# Patient Record
Sex: Female | Born: 1960 | Race: White | Hispanic: No | Smoking: Former smoker
Health system: Southern US, Community
[De-identification: ages and names within clinical notes are randomized; demographics above are authoritative.]

## PROBLEM LIST (undated history)

## (undated) DIAGNOSIS — E119 Type 2 diabetes mellitus without complications: Secondary | ICD-10-CM

## (undated) DIAGNOSIS — E785 Hyperlipidemia, unspecified: Secondary | ICD-10-CM

## (undated) DIAGNOSIS — I10 Essential (primary) hypertension: Secondary | ICD-10-CM

## (undated) HISTORY — DX: Essential (primary) hypertension: I10

## (undated) HISTORY — DX: Hyperlipidemia, unspecified: E78.5

## (undated) HISTORY — DX: Type 2 diabetes mellitus without complications: E11.9

---

## 1999-06-11 ENCOUNTER — Other Ambulatory Visit: Admission: RE | Admit: 1999-06-11 | Discharge: 1999-06-11 | Payer: Self-pay | Admitting: Obstetrics and Gynecology

## 2000-07-20 ENCOUNTER — Other Ambulatory Visit: Admission: RE | Admit: 2000-07-20 | Discharge: 2000-07-20 | Payer: Self-pay | Admitting: Obstetrics and Gynecology

## 2001-07-20 ENCOUNTER — Encounter: Admission: RE | Admit: 2001-07-20 | Discharge: 2001-10-18 | Payer: Self-pay | Admitting: Family Medicine

## 2001-11-22 ENCOUNTER — Other Ambulatory Visit: Admission: RE | Admit: 2001-11-22 | Discharge: 2001-11-22 | Payer: Self-pay | Admitting: Obstetrics and Gynecology

## 2002-06-15 ENCOUNTER — Ambulatory Visit (HOSPITAL_COMMUNITY): Admission: RE | Admit: 2002-06-15 | Discharge: 2002-06-15 | Payer: Self-pay | Admitting: Family Medicine

## 2003-01-31 ENCOUNTER — Other Ambulatory Visit: Admission: RE | Admit: 2003-01-31 | Discharge: 2003-01-31 | Payer: Self-pay | Admitting: Obstetrics and Gynecology

## 2004-03-10 ENCOUNTER — Other Ambulatory Visit: Admission: RE | Admit: 2004-03-10 | Discharge: 2004-03-10 | Payer: Self-pay | Admitting: Obstetrics and Gynecology

## 2004-04-13 ENCOUNTER — Ambulatory Visit (HOSPITAL_BASED_OUTPATIENT_CLINIC_OR_DEPARTMENT_OTHER): Admission: RE | Admit: 2004-04-13 | Discharge: 2004-04-13 | Payer: Self-pay | Admitting: Plastic Surgery

## 2004-04-13 ENCOUNTER — Ambulatory Visit (HOSPITAL_COMMUNITY): Admission: RE | Admit: 2004-04-13 | Discharge: 2004-04-13 | Payer: Self-pay | Admitting: Plastic Surgery

## 2004-04-13 ENCOUNTER — Encounter (INDEPENDENT_AMBULATORY_CARE_PROVIDER_SITE_OTHER): Payer: Self-pay | Admitting: Specialist

## 2005-05-20 ENCOUNTER — Other Ambulatory Visit: Admission: RE | Admit: 2005-05-20 | Discharge: 2005-05-20 | Payer: Self-pay | Admitting: Obstetrics and Gynecology

## 2009-05-18 ENCOUNTER — Ambulatory Visit (HOSPITAL_COMMUNITY): Admission: RE | Admit: 2009-05-18 | Discharge: 2009-05-18 | Payer: Self-pay | Admitting: Urology

## 2013-11-15 ENCOUNTER — Other Ambulatory Visit: Payer: Self-pay | Admitting: Obstetrics and Gynecology

## 2013-11-15 DIAGNOSIS — R928 Other abnormal and inconclusive findings on diagnostic imaging of breast: Secondary | ICD-10-CM

## 2013-12-04 ENCOUNTER — Other Ambulatory Visit: Payer: Self-pay

## 2013-12-06 ENCOUNTER — Ambulatory Visit
Admission: RE | Admit: 2013-12-06 | Discharge: 2013-12-06 | Disposition: A | Discharge status: Home / Self Care | Payer: Managed Care, Other (non HMO) | Source: Ambulatory Visit | Attending: Obstetrics and Gynecology | Admitting: Obstetrics and Gynecology

## 2013-12-06 DIAGNOSIS — R928 Other abnormal and inconclusive findings on diagnostic imaging of breast: Secondary | ICD-10-CM

## 2016-01-27 DIAGNOSIS — M1990 Unspecified osteoarthritis, unspecified site: Secondary | ICD-10-CM | POA: Insufficient documentation

## 2016-01-27 DIAGNOSIS — E782 Mixed hyperlipidemia: Secondary | ICD-10-CM | POA: Insufficient documentation

## 2016-01-27 DIAGNOSIS — I10 Essential (primary) hypertension: Secondary | ICD-10-CM | POA: Insufficient documentation

## 2016-02-15 DIAGNOSIS — S86011A Strain of right Achilles tendon, initial encounter: Secondary | ICD-10-CM | POA: Insufficient documentation

## 2016-02-18 ENCOUNTER — Encounter: Payer: BLUE CROSS/BLUE SHIELD | Attending: Family Medicine | Admitting: Skilled Nursing Facility1

## 2016-02-18 ENCOUNTER — Encounter: Payer: Self-pay | Admitting: Skilled Nursing Facility1

## 2016-02-18 DIAGNOSIS — E119 Type 2 diabetes mellitus without complications: Secondary | ICD-10-CM | POA: Insufficient documentation

## 2016-02-18 DIAGNOSIS — I10 Essential (primary) hypertension: Secondary | ICD-10-CM | POA: Diagnosis not present

## 2016-02-18 DIAGNOSIS — Z713 Dietary counseling and surveillance: Secondary | ICD-10-CM | POA: Diagnosis not present

## 2016-02-18 DIAGNOSIS — E781 Pure hyperglyceridemia: Secondary | ICD-10-CM | POA: Insufficient documentation

## 2016-02-18 NOTE — Progress Notes (Signed)
Diabetes Self-Management Education  Visit Type: First/Initial  Appt. Start Time: 11:00 Appt. End Time: 12:30  02/18/2016  [name], identified by name and date of birth, is a 55 y.o. female with a diagnosis of Diabetes: Type 2.   ASSESSMENT  Height  (1.702 m), weight (!) 324 lb (147 kg). Body mass index is 50.75 kg/m. Pt states she has ankle surgery in a few days for a aceles tendon rupture. Pt states she ate about 2 hours ago and a blood sugar reading was taken with a number of 116.     Diabetes Self-Management Education - 02/18/16 1111      Visit Information   Visit Type First/Initial     Initial Visit   Diabetes Type Type 2   Are you currently following a meal plan? No   Are you taking your medications as prescribed? Not on Medications   Date Diagnosed August 2017     Health Coping   How would you rate your overall health? Fair     Psychosocial Assessment   Patient Belief/Attitude about Diabetes Motivated to manage diabetes     Pre-Education Assessment   Patient understands the diabetes disease and treatment process. Needs Instruction   Patient understands incorporating nutritional management into lifestyle. Needs Instruction   Patient undertands incorporating physical activity into lifestyle. Needs Instruction   Patient understands using medications safely. Needs Instruction   Patient understands monitoring blood glucose, interpreting and using results Needs Instruction   Patient understands prevention, detection, and treatment of acute complications. Needs Instruction   Patient understands prevention, detection, and treatment of chronic complications. Needs Instruction   Patient understands how to develop strategies to address psychosocial issues. Needs Instruction   Patient understands how to develop strategies to promote health/change behavior. Needs Instruction     Complications   Last HgB A1C per patient/outside source 6.9 %   Have you had a  dilated eye exam in the past 12 months? No   Have you had a dental exam in the past 12 months? Yes   Are you checking your feet? No     Dietary Intake   Breakfast egg white omelet with cheese----greek yogurt   Snack (morning) atkins protein bars------cherries----homemade salsa with blacbean chips---edememe    Lunch chicken salad with cheese with nuts------chips and salsa and banana   Snack (afternoon) banana and peanut butter   Dinner vegetable, pork tenderloin   Snack (evening) almonds   Beverage(s) water, diet coke, sparkling water     Exercise   Exercise Type ADL's     Patient Education   Previous Diabetes Education No   Disease state  Definition of diabetes, type 1 and 2, and the diagnosis of diabetes;Factors that contribute to the development of diabetes   Nutrition management  Role of diet in the treatment of diabetes and the relationship between the three main macronutrients and blood glucose level;Food label reading, portion sizes and measuring food.;Carbohydrate counting;Reviewed blood glucose goals for pre and post meals and how to evaluate the patients' food intake on their blood glucose level.;Information on hints to eating out and maintain blood glucose control.   Physical activity and exercise  Role of exercise on diabetes management, blood pressure control and cardiac health.   Monitoring Purpose and frequency of SMBG.;Taught/evaluated SMBG meter.;Yearly dilated eye exam;Daily foot exams   Chronic complications Relationship between chronic complications and blood glucose control;Lipid levels, blood glucose control and heart disease;Retinopathy and reason for yearly dilated eye exams;Assessed and discussed foot  care and prevention of foot problems   Psychosocial adjustment Role of stress on diabetes;Worked with patient to identify barriers to care and solutions;Identified and addressed patients feelings and concerns about diabetes     Individualized Goals (developed by patient)    Nutrition Follow meal plan discussed;Adjust meds/carbs with exercise as discussed   Physical Activity Exercise 1-2 times per week;15 minutes per day   Monitoring  --  Think about starting to test   Reducing Risk do foot checks daily;increase portions of nuts and seeds     Post-Education Assessment   Patient understands the diabetes disease and treatment process. Demonstrates understanding / competency   Patient understands incorporating nutritional management into lifestyle. Demonstrates understanding / competency   Patient undertands incorporating physical activity into lifestyle. Demonstrates understanding / competency   Patient understands using medications safely. Demonstrates understanding / competency   Patient understands monitoring blood glucose, interpreting and using results Demonstrates understanding / competency   Patient understands prevention, detection, and treatment of acute complications. Demonstrates understanding / competency   Patient understands prevention, detection, and treatment of chronic complications. Demonstrates understanding / competency   Patient understands how to develop strategies to address psychosocial issues. Demonstrates understanding / competency   Patient understands how to develop strategies to promote health/change behavior. Demonstrates understanding / competency     Outcomes   Expected Outcomes Demonstrated interest in learning. Expect positive outcomes   Future DMSE PRN   Program Status Completed      Individualized Plan for Diabetes Self-Management Training:   Learning Objective:  Patient will have a greater understanding of diabetes self-management. Patient education plan is to attend individual and/or group sessions per assessed needs and concerns.   Plan:   Patient Instructions  -Eat 3 meals a day and snacks in between (if you are hungry for the snacks) -A meal: carbohydrate, protein, vegetable -A snack: A Fruit OR Vegetable AND  Protein -Honor your body by listening to your hunger and fullness cues -First thought: Am I hungry? -Second thought: (if the answer is yes I am hungry) Does this meal have vegetables? Protein? Carbohydrate?  -Third thought: Are there more vegetables on my plate compared to Protein and Carbohydrates? -After you have finished your first serving Do Not go back for more until you have waited 20-30 minutes and checked in with your body by asking Am I Still Hungry?   -Try to be as active as possible -Start trying to eat within an hour-hour and a half of waking -If you are eating snacks: 2-3 hours after the meal and before the next meal -If you are not eating snacks do not go more than 5 hours without eating your next meal -For your bread the first ingredient should be whole wheat flour   Expected Outcomes:  Demonstrated interest in learning. Expect positive outcomes  Education material provided: Living Well with Diabetes, Meal plan card, My Plate and Snack sheet  If problems or questions, patient to contact team via:  Phone  Future DSME appointment: PRN

## 2016-02-18 NOTE — Patient Instructions (Signed)
-  Eat 3 meals a day and snacks in between (if you are hungry for the snacks) -A meal: carbohydrate, protein, vegetable -A snack: A Fruit OR Vegetable AND Protein -Honor your body by listening to your hunger and fullness cues -First thought: Am I hungry? -Second thought: (if the answer is yes I am hungry) Does this meal have vegetables? Protein? Carbohydrate?  -Third thought: Are there more vegetables on my plate compared to Protein and Carbohydrates? -After you have finished your first serving Do Not go back for more until you have waited 20-30 minutes and checked in with your body by asking Am I Still Hungry?   -Try to be as active as possible -Start trying to eat within an hour-hour and a half of waking -If you are eating snacks: 2-3 hours after the meal and before the next meal -If you are not eating snacks do not go more than 5 hours without eating your next meal -For your bread the first ingredient should be whole wheat flour

## 2016-02-26 ENCOUNTER — Encounter: Payer: Self-pay | Admitting: Skilled Nursing Facility1

## 2016-05-16 ENCOUNTER — Other Ambulatory Visit: Payer: Self-pay | Admitting: Obstetrics and Gynecology

## 2016-05-16 DIAGNOSIS — R928 Other abnormal and inconclusive findings on diagnostic imaging of breast: Secondary | ICD-10-CM

## 2016-05-19 ENCOUNTER — Ambulatory Visit
Admission: RE | Admit: 2016-05-19 | Discharge: 2016-05-19 | Disposition: A | Discharge status: Home / Self Care | Payer: BLUE CROSS/BLUE SHIELD | Source: Ambulatory Visit | Attending: Obstetrics and Gynecology | Admitting: Obstetrics and Gynecology

## 2016-05-19 ENCOUNTER — Other Ambulatory Visit: Payer: Self-pay | Admitting: Obstetrics and Gynecology

## 2016-05-19 DIAGNOSIS — R928 Other abnormal and inconclusive findings on diagnostic imaging of breast: Secondary | ICD-10-CM

## 2016-05-19 DIAGNOSIS — R229 Localized swelling, mass and lump, unspecified: Principal | ICD-10-CM

## 2016-05-19 DIAGNOSIS — IMO0002 Reserved for concepts with insufficient information to code with codable children: Secondary | ICD-10-CM

## 2016-05-24 ENCOUNTER — Ambulatory Visit
Admission: RE | Admit: 2016-05-24 | Discharge: 2016-05-24 | Disposition: A | Discharge status: Home / Self Care | Payer: BLUE CROSS/BLUE SHIELD | Source: Ambulatory Visit | Attending: Obstetrics and Gynecology | Admitting: Obstetrics and Gynecology

## 2016-05-24 ENCOUNTER — Other Ambulatory Visit: Payer: Self-pay | Admitting: Obstetrics and Gynecology

## 2016-05-24 DIAGNOSIS — IMO0002 Reserved for concepts with insufficient information to code with codable children: Secondary | ICD-10-CM

## 2016-05-24 DIAGNOSIS — R229 Localized swelling, mass and lump, unspecified: Principal | ICD-10-CM

## 2017-08-12 IMAGING — US ULTRASOUND LEFT BREAST LIMITED
1 series · 8 of 8 positions shown · non-contrast
Comparison: Previous exam(s).

CLINICAL DATA: Possible mass identified in the left breast on
recent screening mammogram performed 05/10/2016.

EXAM:
2D DIGITAL DIAGNOSTIC LEFT MAMMOGRAM WITH CAD AND ADJUNCT TOMO
ULTRASOUND LEFT BREAST

[Series 1: ultrasound left breast limited · 0.06mm/px · 8 of 8 slices shown]
[im 1/8]
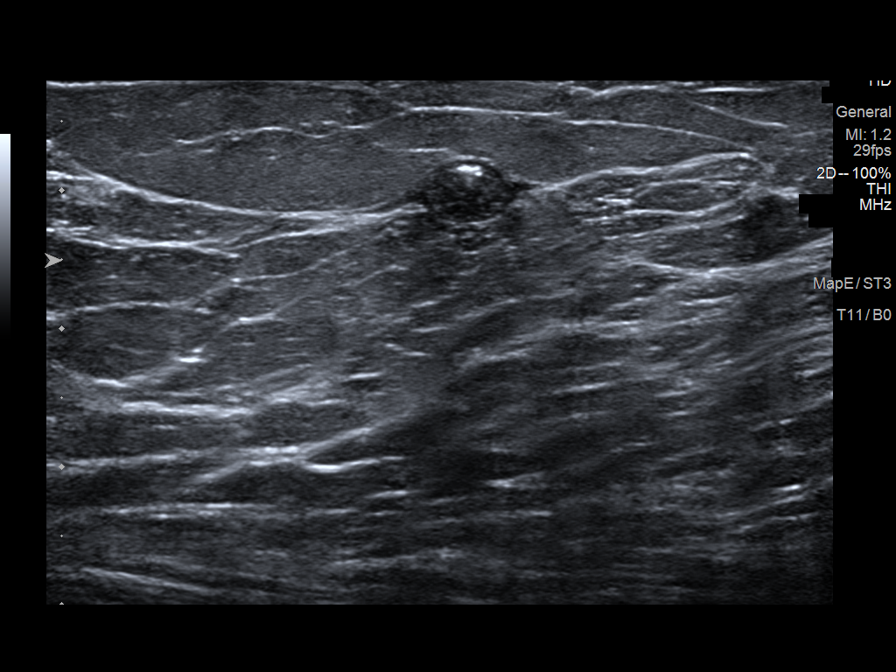
[im 2/8]
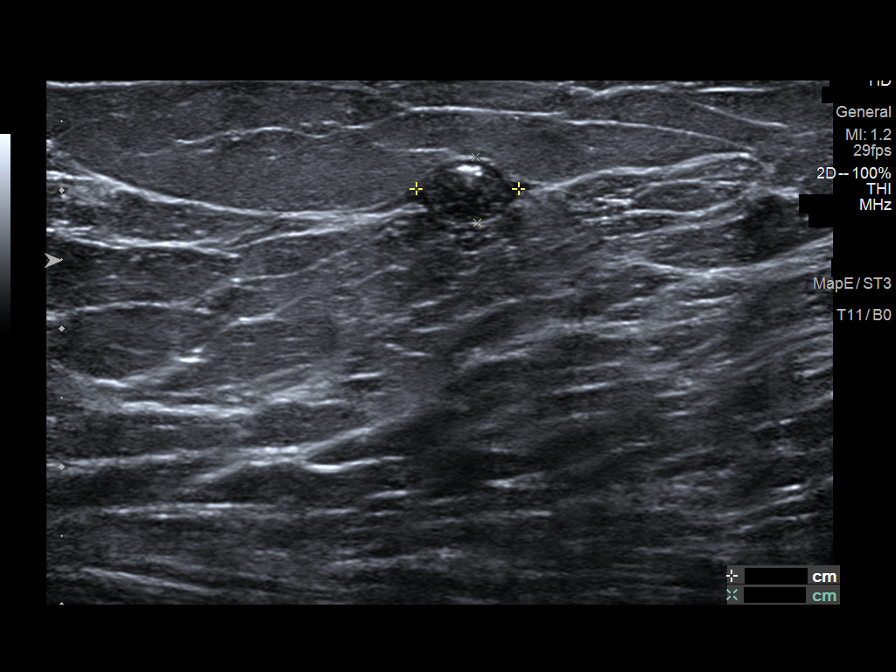
[im 3/8]
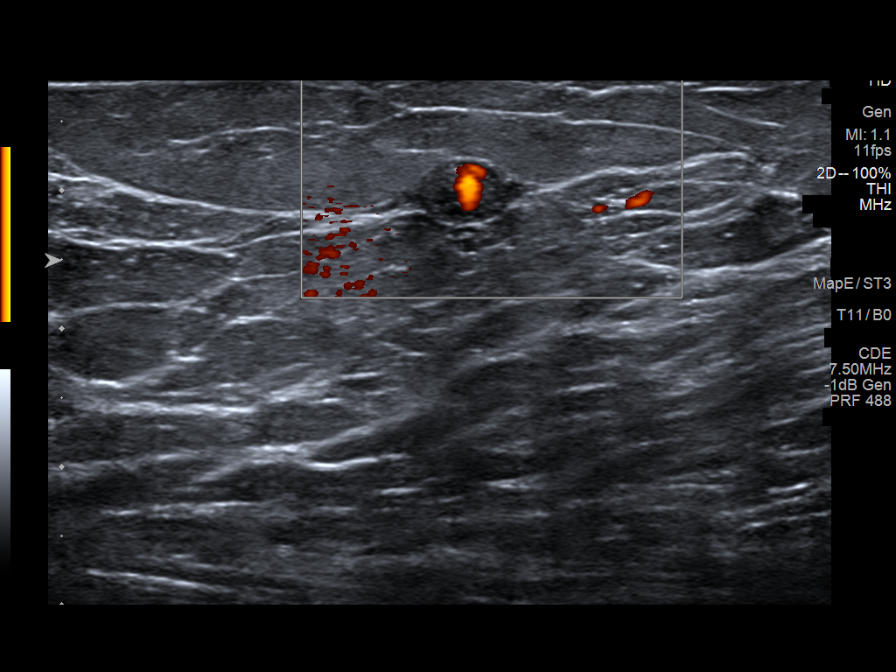
[im 4/8]
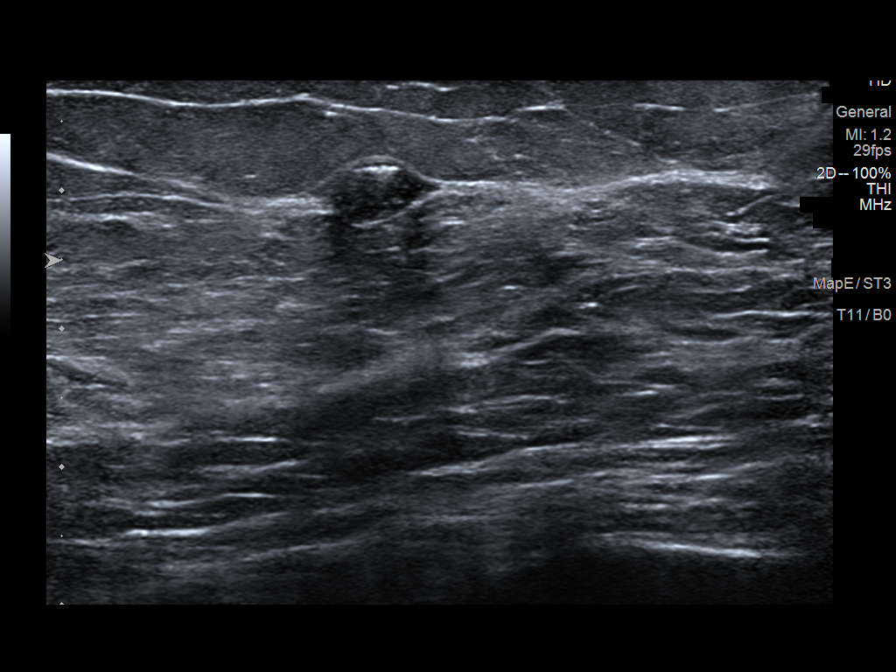
[im 5/8]
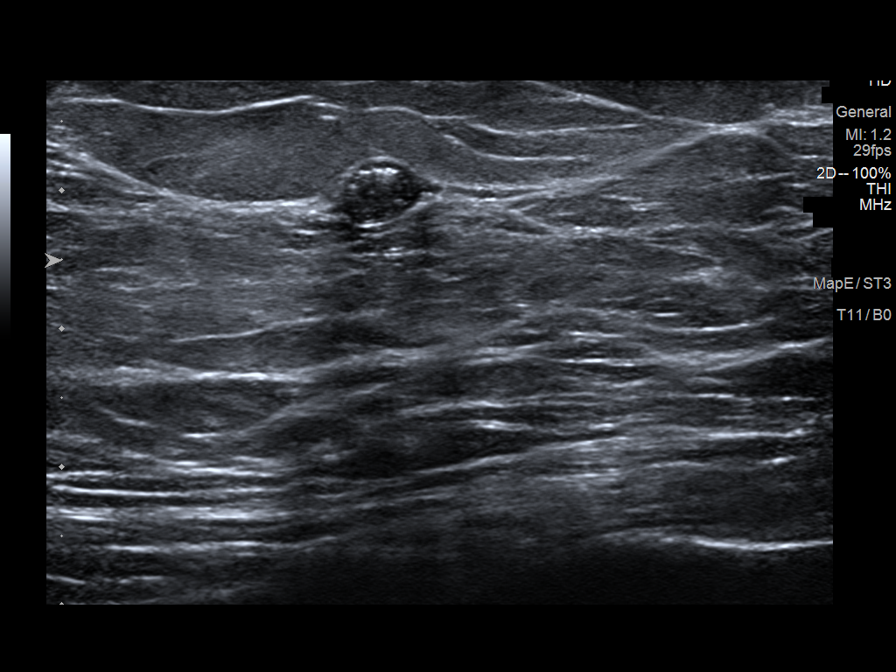
[im 6/8]
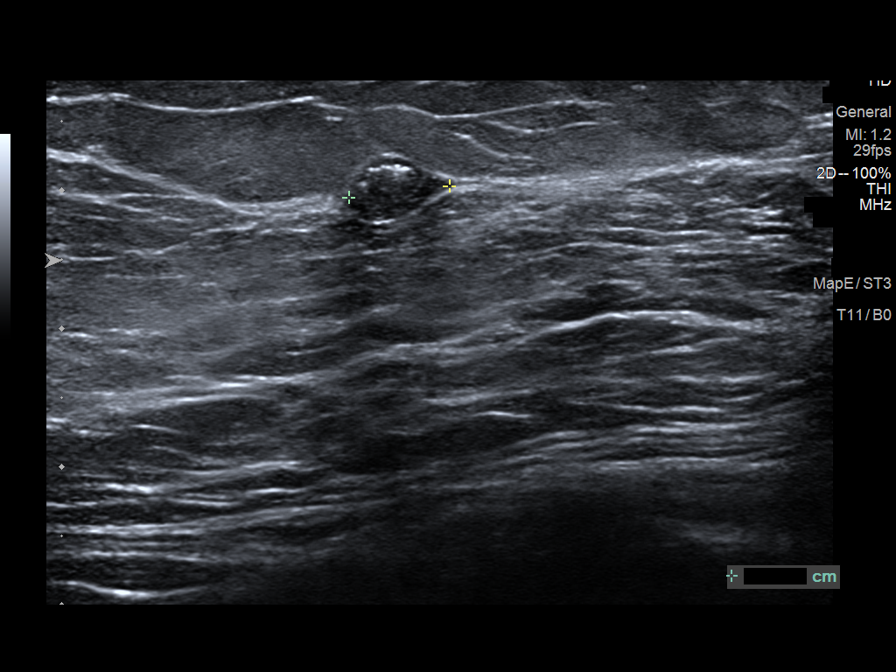
[im 7/8]
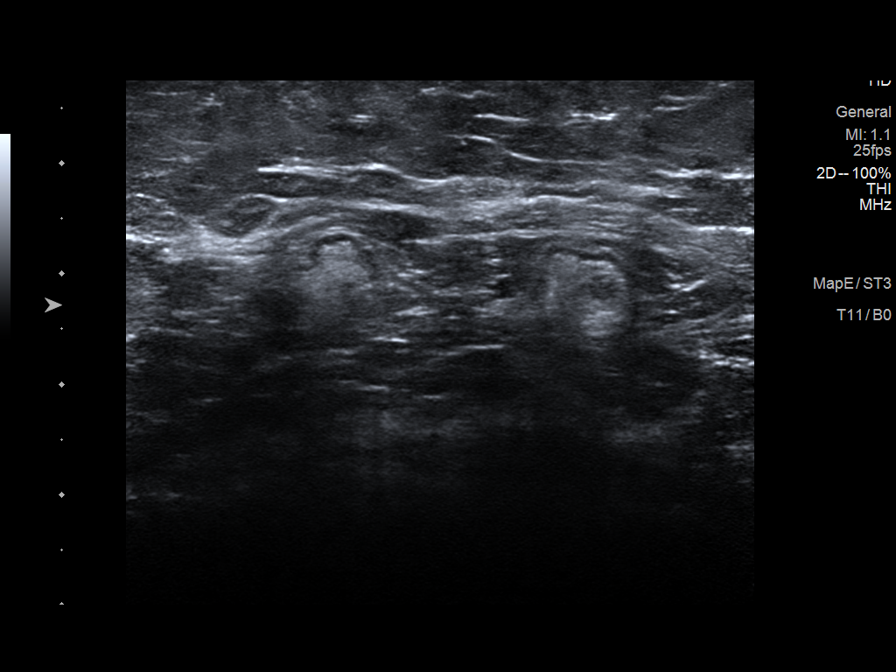
[im 8/8]
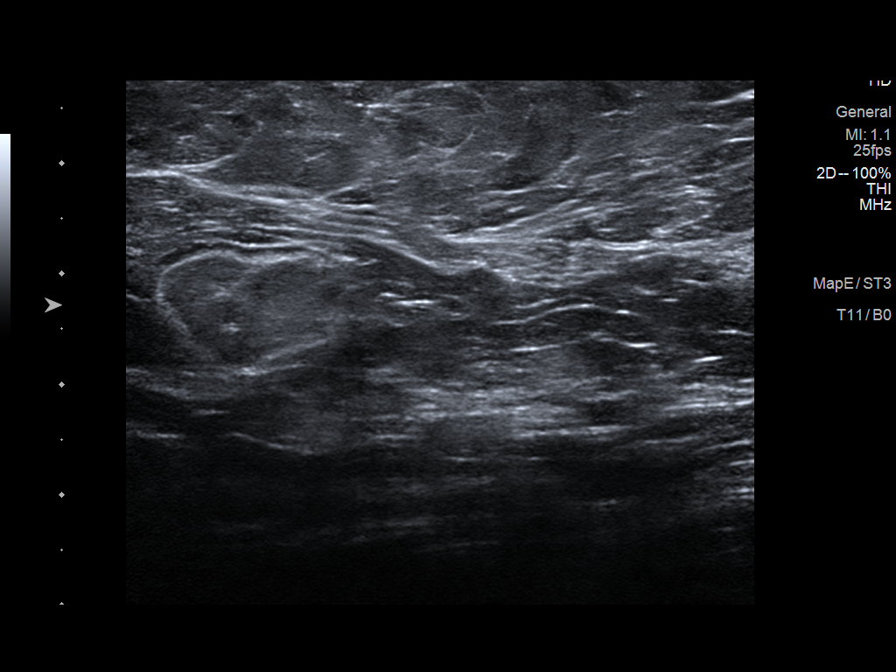

[8 of 8 positions shown; findings below may reference images not displayed]

ACR Breast Density Category b: There are scattered areas of
fibroglandular density.
FINDINGS: Cc and MLO whole breast views including tomography confirm an an
oval mass with focal internal calcifications, with partially
circumscribed and partially indistinct margins in the outer left
breast, 3 o'clock region.

Mammographic images were processed with CAD.

Targeted ultrasound is performed, showing a circumscribed hypoechoic
oval mass with internal calcification is imaged at 3 o'clock
position 3 cm from the nipple, measuring 0.7 x 0.7 x 0.5 cm. On
color Doppler imaging, there is probable artifact related to the
internal calcification, rather than true vascular flow. Ultrasound
of the left axilla shows several normal left axillary lymph nodes.
No lymphadenopathy is detected.
IMPRESSION: New 0.7 cm mass in the 3 o'clock position of the left breast.

RECOMMENDATION:
Ultrasound-guided biopsy is suggested to exclude malignancy. The
procedure for biopsy was discussed with the patient today. She is
being scheduled for an ultrasound-guided left breast biopsy.

I have discussed the findings and recommendations with the patient.
Results were also provided in writing at the conclusion of the
visit. If applicable, a reminder letter will be sent to the patient
regarding the next appointment.

BI-RADS CATEGORY  4: Suspicious.

## 2017-08-12 IMAGING — MG 2D DIGITAL DIAGNOSTIC UNILATERAL LEFT MAMMOGRAM WITH CAD AND ADJ
6 series · 6 of 14 positions shown · non-contrast
Comparison: Previous exam(s).

CLINICAL DATA: Possible mass identified in the left breast on
recent screening mammogram performed 05/10/2016.

EXAM:
2D DIGITAL DIAGNOSTIC LEFT MAMMOGRAM WITH CAD AND ADJUNCT TOMO
ULTRASOUND LEFT BREAST

[L MLO]
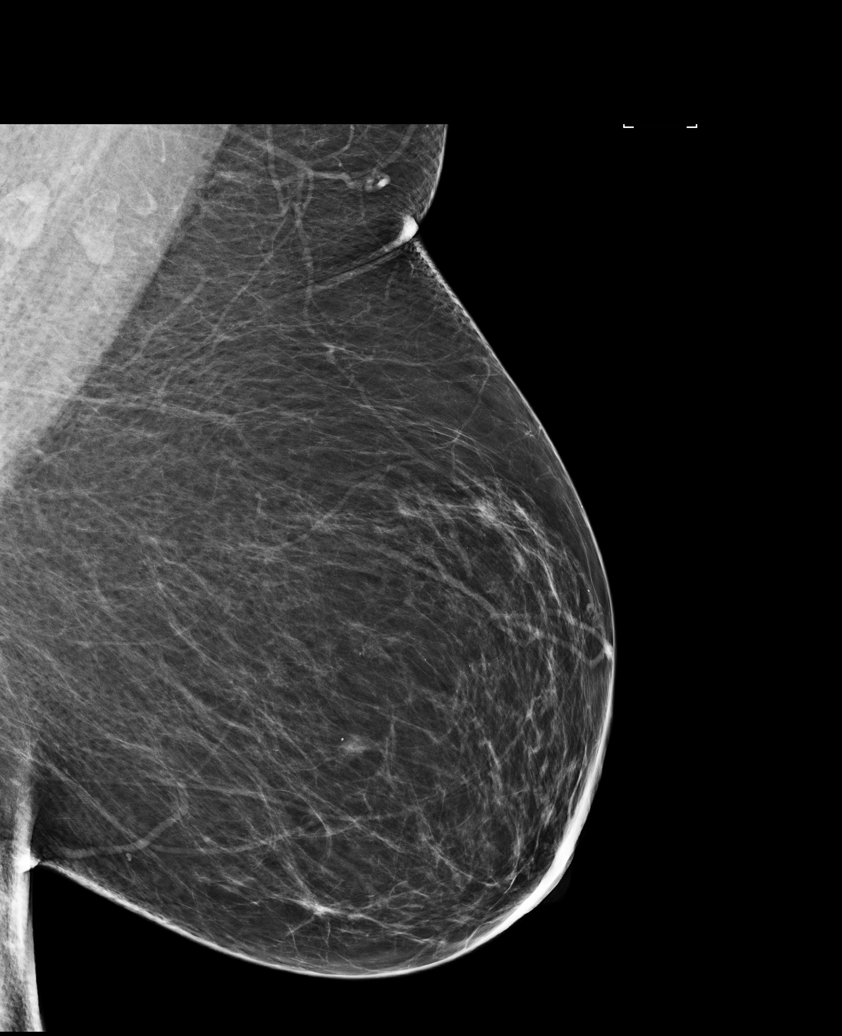

[L CC]
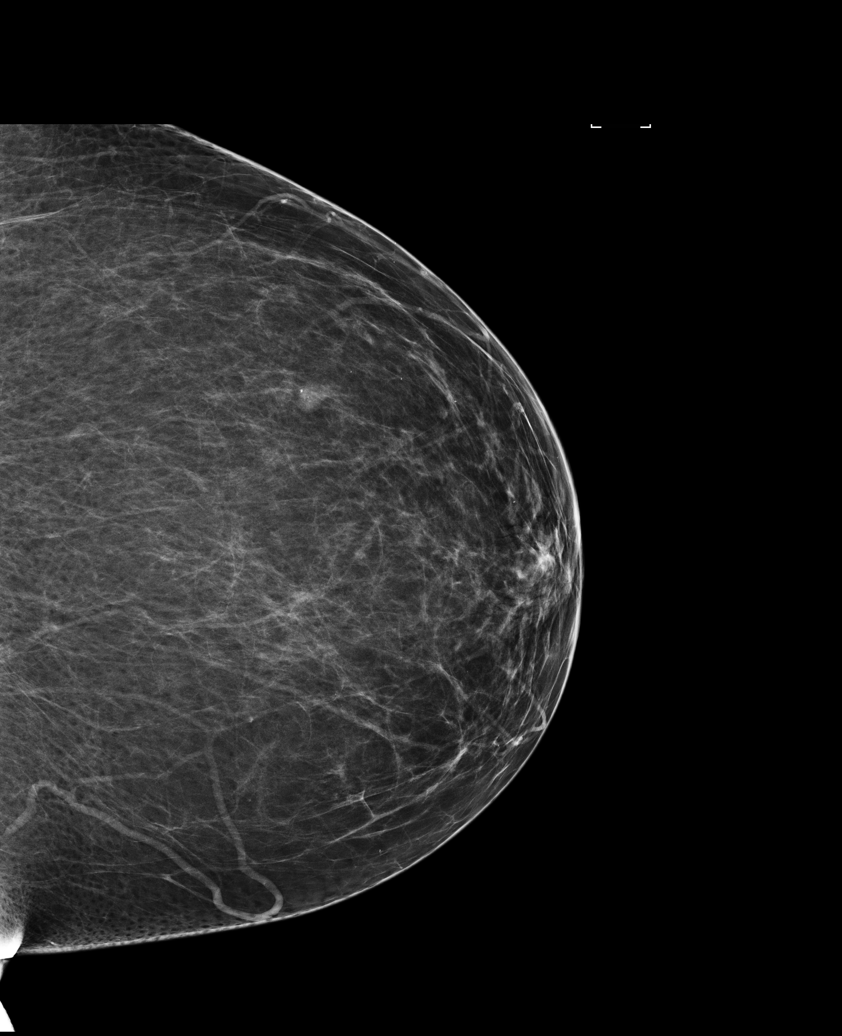

[L MLO synth-2D]
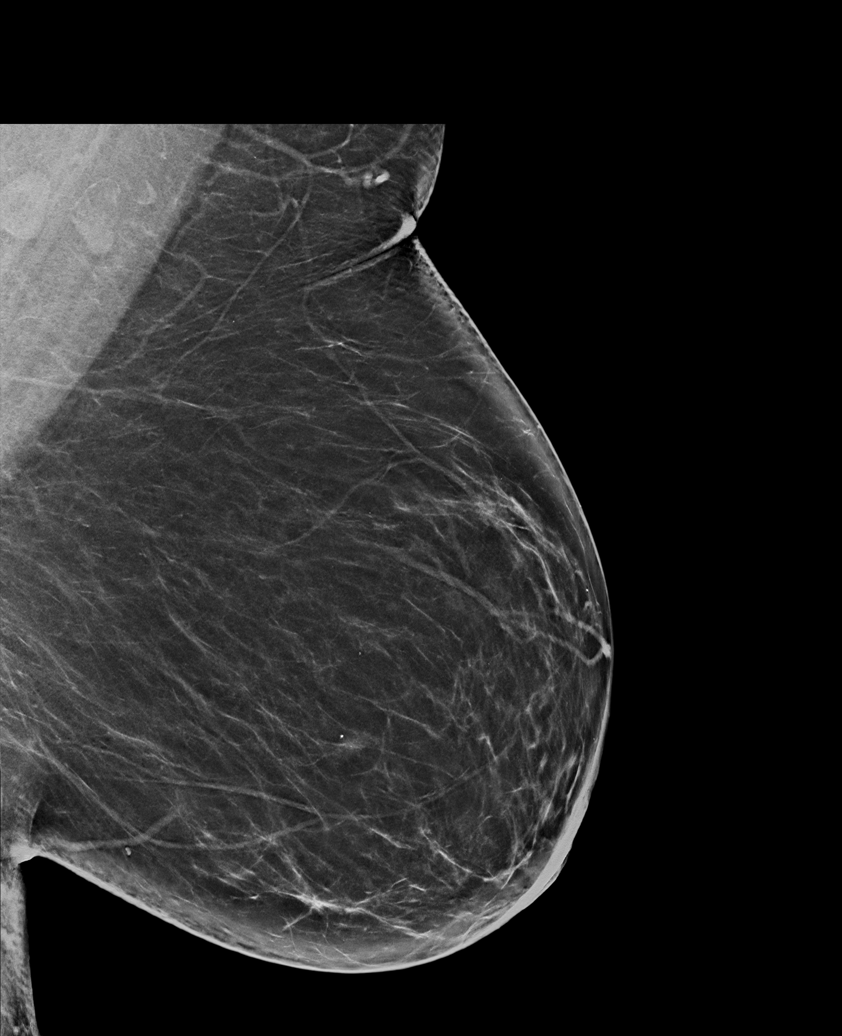

[L CC synth-2D]
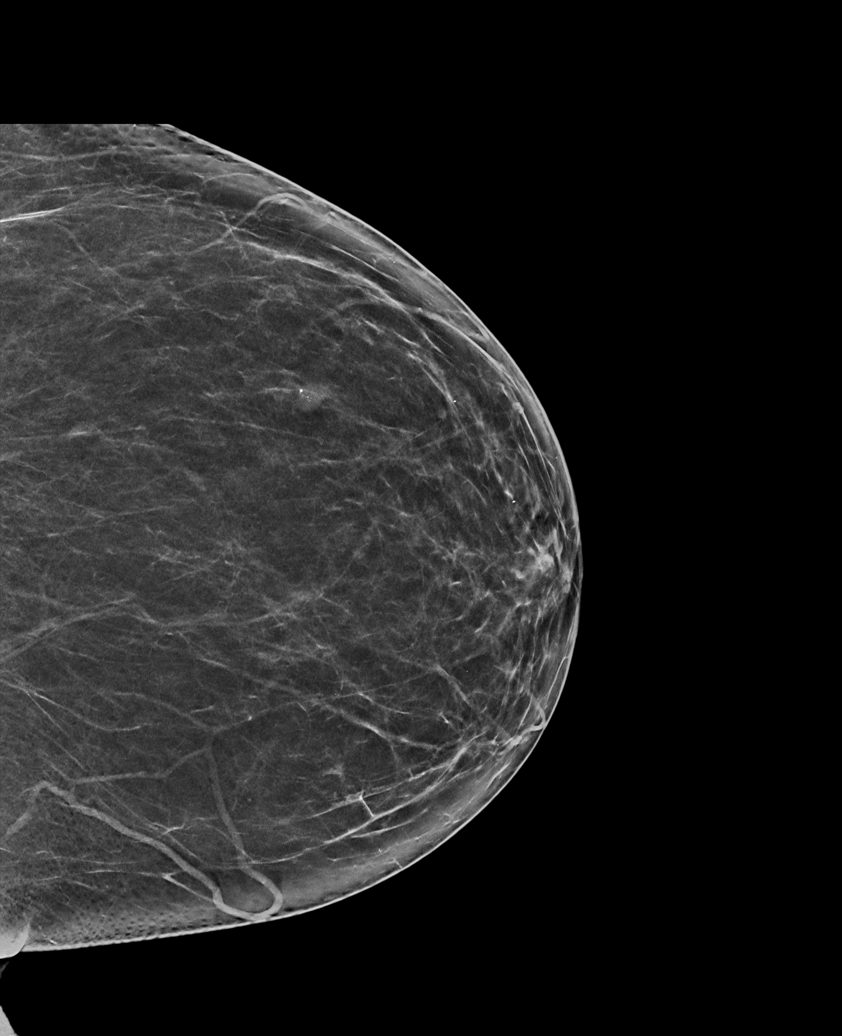

[L CC tomo · tomo slice 35/69.0]
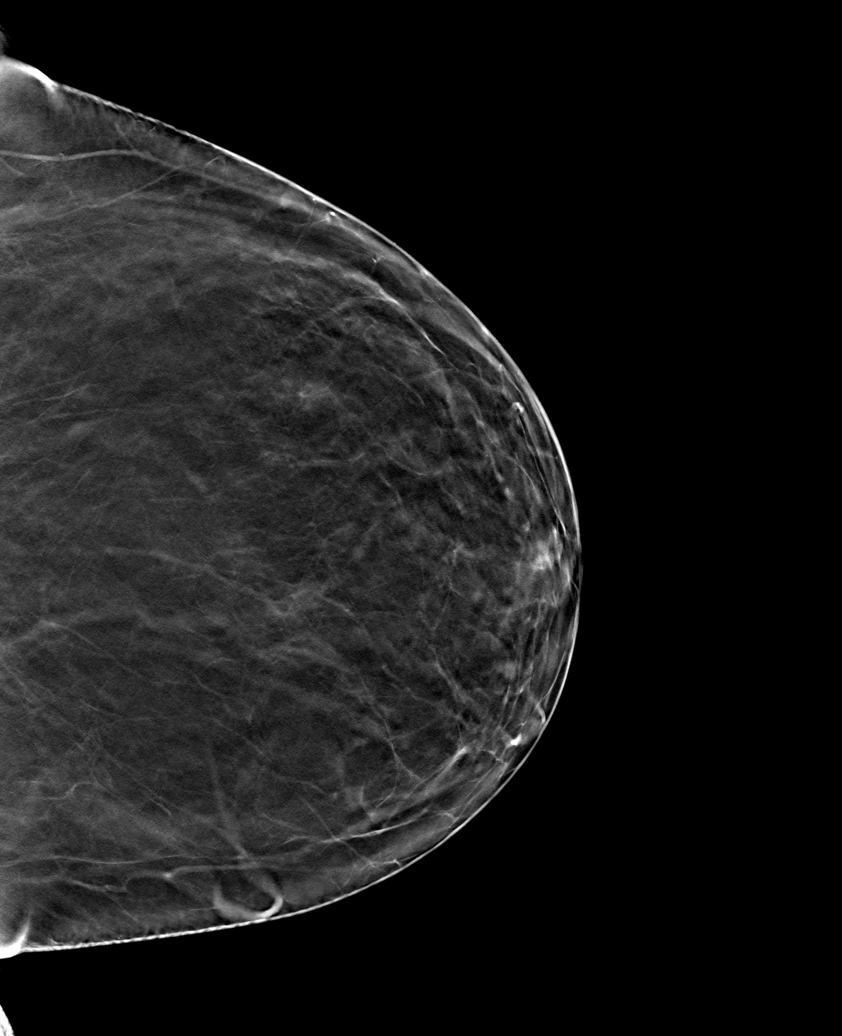

[L MLO tomo · tomo slice 42/83.0]
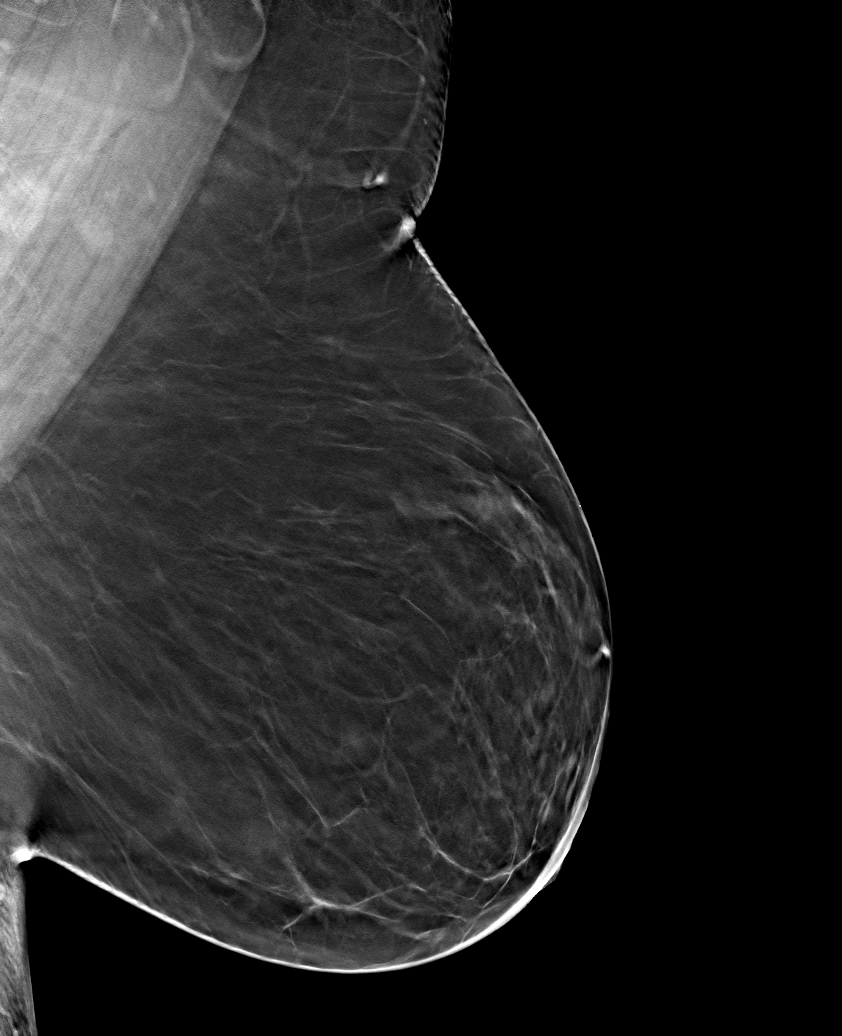

[6 of 14 positions shown; findings below may reference images not displayed]

ACR Breast Density Category b: There are scattered areas of
fibroglandular density.
FINDINGS: Cc and MLO whole breast views including tomography confirm an an
oval mass with focal internal calcifications, with partially
circumscribed and partially indistinct margins in the outer left
breast, 3 o'clock region.

Mammographic images were processed with CAD.

Targeted ultrasound is performed, showing a circumscribed hypoechoic
oval mass with internal calcification is imaged at 3 o'clock
position 3 cm from the nipple, measuring 0.7 x 0.7 x 0.5 cm. On
color Doppler imaging, there is probable artifact related to the
internal calcification, rather than true vascular flow. Ultrasound
of the left axilla shows several normal left axillary lymph nodes.
No lymphadenopathy is detected.
IMPRESSION: New 0.7 cm mass in the 3 o'clock position of the left breast.

RECOMMENDATION:
Ultrasound-guided biopsy is suggested to exclude malignancy. The
procedure for biopsy was discussed with the patient today. She is
being scheduled for an ultrasound-guided left breast biopsy.

I have discussed the findings and recommendations with the patient.
Results were also provided in writing at the conclusion of the
visit. If applicable, a reminder letter will be sent to the patient
regarding the next appointment.

BI-RADS CATEGORY  4: Suspicious.

## 2018-08-14 DIAGNOSIS — E785 Hyperlipidemia, unspecified: Secondary | ICD-10-CM | POA: Diagnosis not present

## 2018-08-14 DIAGNOSIS — I10 Essential (primary) hypertension: Secondary | ICD-10-CM | POA: Diagnosis not present

## 2018-08-14 DIAGNOSIS — E119 Type 2 diabetes mellitus without complications: Secondary | ICD-10-CM | POA: Diagnosis not present

## 2018-10-11 DIAGNOSIS — L920 Granuloma annulare: Secondary | ICD-10-CM | POA: Diagnosis not present

## 2018-10-11 DIAGNOSIS — L821 Other seborrheic keratosis: Secondary | ICD-10-CM | POA: Diagnosis not present

## 2019-08-14 ENCOUNTER — Other Ambulatory Visit: Payer: Self-pay | Admitting: Family Medicine

## 2019-08-14 ENCOUNTER — Ambulatory Visit
Admission: RE | Admit: 2019-08-14 | Discharge: 2019-08-14 | Disposition: A | Discharge status: Home / Self Care | Payer: 59 | Source: Ambulatory Visit | Attending: Family Medicine | Admitting: Family Medicine

## 2019-08-14 DIAGNOSIS — N200 Calculus of kidney: Secondary | ICD-10-CM

## 2019-08-14 DIAGNOSIS — R9389 Abnormal findings on diagnostic imaging of other specified body structures: Secondary | ICD-10-CM

## 2020-04-20 ENCOUNTER — Other Ambulatory Visit: Payer: Self-pay | Admitting: Family Medicine

## 2020-04-20 DIAGNOSIS — N281 Cyst of kidney, acquired: Secondary | ICD-10-CM

## 2020-08-20 DIAGNOSIS — M1611 Unilateral primary osteoarthritis, right hip: Secondary | ICD-10-CM | POA: Insufficient documentation

## 2020-10-26 DIAGNOSIS — E119 Type 2 diabetes mellitus without complications: Secondary | ICD-10-CM | POA: Insufficient documentation

## 2021-11-10 ENCOUNTER — Ambulatory Visit: Payer: 59 | Admitting: Family Medicine

## 2022-03-16 ENCOUNTER — Other Ambulatory Visit (HOSPITAL_BASED_OUTPATIENT_CLINIC_OR_DEPARTMENT_OTHER): Payer: Self-pay | Admitting: Family Medicine

## 2022-03-16 DIAGNOSIS — E782 Mixed hyperlipidemia: Secondary | ICD-10-CM

## 2022-04-15 ENCOUNTER — Ambulatory Visit (HOSPITAL_BASED_OUTPATIENT_CLINIC_OR_DEPARTMENT_OTHER)
Admission: RE | Admit: 2022-04-15 | Discharge: 2022-04-15 | Disposition: A | Discharge status: Home / Self Care | Payer: 59 | Source: Ambulatory Visit | Attending: Family Medicine | Admitting: Family Medicine

## 2022-04-15 DIAGNOSIS — E782 Mixed hyperlipidemia: Secondary | ICD-10-CM | POA: Insufficient documentation

## 2022-05-17 NOTE — Progress Notes (Deleted)
Cardiology Office Note:    Date:  05/17/2022   ID:  Katrina Garner, DOB [birth year], MRN [id number]  PCP:  Shirlean Mylar, MD   [facility] Providers Cardiologist:  None {  Referring MD: Shon Hale, *    History of Present Illness:    Katrina Garner is a 61 y.o. female with a hx of HTN, HLD, and DMII who was referred by Dr. Chanetta Marshall for further evaluation of CAD.  Patient was seen by Dr. Chanetta Marshall on 04/25/22. Note reviewed. Had Coronary Ca score of 348 (95%). She is now referred to Cardiology for further management.  Today, ***  Past Medical History:  Diagnosis Date   DM (diabetes mellitus) (HCC)    Hyperlipidemia    Hypertension     *** The histories are not reviewed yet. Please review them in the "History" navigator section and refresh this SmartLink.  Current Medications: No outpatient medications have been marked as taking for the 05/19/22 encounter (Appointment) with Meriam Sprague, MD.     Allergies:   Patient has no allergy information on record.   Social History   Socioeconomic History   Marital status: Widowed    Spouse name: Not on file   Number of children: Not on file   Years of education: Not on file   Highest education level: Not on file  Occupational History   Not on file  Tobacco Use   Smoking status: Former    Types: Cigarettes   Smokeless tobacco: Not on file  Substance and Sexual Activity   Alcohol use: Yes   Drug use: Not on file   Sexual activity: Not on file  Other Topics Concern   Not on file  Social History Narrative   Not on file   Social Determinants of Health   Financial Resource Strain: Not on file  Food Insecurity: Not on file  Transportation Needs: Not on file  Physical Activity: Not on file  Stress: Not on file  Social Connections: Not on file     Family History: The patient's ***family history includes Hyperlipidemia in an other family member; Stroke in an other family member.  ROS:   Please  see the history of present illness.    *** All other systems reviewed and are negative.  EKGs/Labs/Other Studies Reviewed:    The following studies were reviewed today: Ca score 04/2022: FINDINGS: Non-cardiac: No significant non cardiac findings on limited lung and soft tissue windows. See separate report from [facility].   Ascending Aorta: Normal diameter 3.5 cm   Pericardium: Normal   Coronary arteries: 3 vessel calcium noted   LM 0   LAD 181   LCX 89.2   RCA 77.9   Total 348   IMPRESSION: Coronary calcium score of 348. This was 95 th percentile for age and sex matched control.  EKG:  EKG is *** ordered today.  The ekg ordered today demonstrates ***  Recent Labs: No results found for requested labs within last 365 days.  Recent Lipid Panel No results found for: "CHOL", "TRIG", "HDL", "CHOLHDL", "VLDL", "LDLCALC", "LDLDIRECT"   Risk Assessment/Calculations:   {Does this patient have ATRIAL FIBRILLATION?:[phone]}  No BP recorded.  {Refresh Note OR Click here to enter BP  :1}***         Physical Exam:    VS:  There were no vitals taken for this visit.    Wt Readings from Last 3 Encounters:  02/18/16 (!) 324 lb (147 kg)  GEN: *** Well nourished, well developed in no acute distress HEENT: Normal NECK: No JVD; No carotid bruits LYMPHATICS: No lymphadenopathy CARDIAC: ***RRR, no murmurs, rubs, gallops RESPIRATORY:  Clear to auscultation without rales, wheezing or rhonchi  ABDOMEN: Soft, non-tender, non-distended MUSCULOSKELETAL:  No edema; No deformity  SKIN: Warm and dry NEUROLOGIC:  Alert and oriented x 3 PSYCHIATRIC:  Normal affect   ASSESSMENT:    No diagnosis found. PLAN:    In order of problems listed above:  #Coronary Artery Ca: Ca score 348 which was the 95% for age, gender, and race matched controls. Currently, *** -Continue lipitor 80mg  daily  #HTN: Well controlled and at goal. -Continue HCTZ 25mg  daily      {Are  you ordering a CV Procedure (e.g. stress test, cath, DCCV, TEE, etc)?   Press F2        :[id number]}    Medication Adjustments/Labs and Tests Ordered: Current medicines are reviewed at length with the patient today.  Concerns regarding medicines are outlined above.  No orders of the defined types were placed in this encounter.  No orders of the defined types were placed in this encounter.   There are no Patient Instructions on file for this visit.   Signed, Freada Bergeron, MD  05/17/2022 8:42 PM    [place]

## 2022-05-19 ENCOUNTER — Ambulatory Visit: Payer: 59 | Admitting: Cardiology

## 2022-05-19 DIAGNOSIS — N951 Menopausal and female climacteric states: Secondary | ICD-10-CM | POA: Insufficient documentation

## 2022-05-19 NOTE — Progress Notes (Incomplete)
Cardiology Office Note:    Date:  05/19/2022   ID:  Katrina Garner, DOB 1961/01/22, MRN [id number]  PCP:  Shirlean Mylar, MD   [facility] Providers Cardiologist:  None {  Referring MD: Shon Hale, *    History of Present Illness:    Katrina Garner is a 61 y.o. female with a hx of HTN, HLD, and DMII who was referred by Dr. Chanetta Marshall for further evaluation of CAD.  Patient was seen by Dr. Chanetta Marshall on 04/25/22. Note reviewed. Had Coronary Ca score of 348 (95%). She is now referred to Cardiology for further management.  Today, the patient states that***  She denies any palpitations, chest pain, shortness of breath, or peripheral edema. No lightheadedness, headaches, syncope, orthopnea, or PND.   Past Medical History:  Diagnosis Date   DM (diabetes mellitus) (HCC)    Hyperlipidemia    Hypertension     *** The histories are not reviewed yet. Please review them in the "History" navigator section and refresh this SmartLink.  Current Medications: No outpatient medications have been marked as taking for the 05/19/22 encounter (Appointment) with Meriam Sprague, MD.     Allergies:   Patient has no allergy information on record.   Social History   Socioeconomic History   Marital status: Widowed    Spouse name: Not on file   Number of children: Not on file   Years of education: Not on file   Highest education level: Not on file  Occupational History   Not on file  Tobacco Use   Smoking status: Former    Types: Cigarettes   Smokeless tobacco: Not on file  Substance and Sexual Activity   Alcohol use: Yes   Drug use: Not on file   Sexual activity: Not on file  Other Topics Concern   Not on file  Social History Narrative   Not on file   Social Determinants of Health   Financial Resource Strain: Not on file  Food Insecurity: Not on file  Transportation Needs: Not on file  Physical Activity: Not on file  Stress: Not on file  Social Connections:  Not on file     Family History:  The patient's family history includes Hyperlipidemia in an other family member; Stroke in an other family member.  ROS:    Review of Systems  Constitutional:  Negative for chills and fever.  HENT:  Negative for nosebleeds and tinnitus.   Eyes:  Negative for blurred vision and pain.  Respiratory:  Negative for cough, hemoptysis, shortness of breath and stridor.   Cardiovascular:  Negative for chest pain, palpitations, orthopnea, claudication, leg swelling and PND.  Gastrointestinal:  Negative for blood in stool, diarrhea, nausea and vomiting.  Genitourinary:  Negative for dysuria and hematuria.  Musculoskeletal:  Negative for falls.  Neurological:  Negative for dizziness, loss of consciousness and headaches.  Psychiatric/Behavioral:  Negative for depression, hallucinations and substance abuse. The patient does not have insomnia.     EKGs/Labs/Other Studies Reviewed:    The following studies were reviewed today:  Ca score 04/2022:  FINDINGS: Non-cardiac: No significant non cardiac findings on limited lung and soft tissue windows. See separate report from [facility].   Ascending Aorta: Normal diameter 3.5 cm   Pericardium: Normal   Coronary arteries: 3 vessel calcium noted   LM 0   LAD 181   LCX 89.2   RCA 77.9   Total 348   IMPRESSION: Coronary calcium score of 348. This  was 95 th percentile for age and sex matched control.  EKG:  EKG has been personally reviewed.  05/19/2022: Sinus ***. Rate *** bpm.   Recent Labs: No results found for requested labs within last 365 days.   Recent Lipid Panel No results found for: "CHOL", "TRIG", "HDL", "CHOLHDL", "VLDL", "LDLCALC", "LDLDIRECT"   Risk Assessment/Calculations:   {Does this patient have ATRIAL FIBRILLATION?:[phone]}  No BP recorded.  {Refresh Note OR Click here to enter BP  :1}***         Physical Exam:    VS:  There were no vitals taken for this visit.     Wt Readings from Last 3 Encounters:  02/18/16 (!) 324 lb (147 kg)     GEN: Well nourished, well developed in no acute distress HEENT: Normal NECK: No JVD; No carotid bruits LYMPHATICS: No lymphadenopathy CARDIAC: *** RRR, no murmurs, rubs, gallops RESPIRATORY:  Clear to auscultation without rales, wheezing or rhonchi  ABDOMEN: Soft, non-tender, non-distended MUSCULOSKELETAL:  No edema; No deformity  SKIN: Warm and dry NEUROLOGIC:  Alert and oriented x 3 PSYCHIATRIC:  Normal affect   ASSESSMENT:    No diagnosis found. PLAN:    In order of problems listed above:  #Coronary Artery Ca: Ca score 348 which was the 95% for age, gender, and race matched controls. Currently, *** -Continue lipitor 80mg  daily  #HTN: Well controlled and at goal. -Continue HCTZ 25mg  daily      {Are you ordering a CV Procedure (e.g. stress test, cath, DCCV, TEE, etc)?   Press F2        :  Follow-up:  ***  Medication Adjustments/Labs and Tests Ordered: Current medicines are reviewed at length with the patient today.  Concerns regarding medicines are outlined above.   No orders of the defined types were placed in this encounter.  No orders of the defined types were placed in this encounter.  There are no Patient Instructions on file for this visit.   I,Mitra Faeizi,acting as a for [id number]}, MD.,have documented all relevant documentation on the behalf of Neurosurgeon, MD,as directed by  Meriam Sprague, MD while in the presence of Meriam Sprague, MD.  ***  Signed, Meriam Sprague  05/19/2022 1:59 PM    [facility]

## 2022-05-23 NOTE — Progress Notes (Unsigned)
Cardiology Office Note:    Date:  05/23/2022   ID:  Katrina Garner, DOB 1961-03-14, MRN [id number]  PCP:  Shirlean Mylar, MD   [facility] Providers Cardiologist:  None { Click to update primary MD,subspecialty MD or APP then REFRESH:1}    Referring MD: Shon Hale, *   No chief complaint on file. ***  History of Present Illness:    Katrina Garner is a 61 y.o. female with a hx of ***  Past Medical History:  Diagnosis Date   DM (diabetes mellitus) (HCC)    Hyperlipidemia    Hypertension     *** The histories are not reviewed yet. Please review them in the "History" navigator section and refresh this SmartLink.  Current Medications: No outpatient medications have been marked as taking for the 05/26/22 encounter (Appointment) with Meriam Sprague, MD.     Allergies:   Patient has no allergy information on record.   Social History   Socioeconomic History   Marital status: Widowed    Spouse name: Not on file   Number of children: Not on file   Years of education: Not on file   Highest education level: Not on file  Occupational History   Not on file  Tobacco Use   Smoking status: Former    Types: Cigarettes   Smokeless tobacco: Not on file  Substance and Sexual Activity   Alcohol use: Yes   Drug use: Not on file   Sexual activity: Not on file  Other Topics Concern   Not on file  Social History Narrative   Not on file   Social Determinants of Health   Financial Resource Strain: Not on file  Food Insecurity: Not on file  Transportation Needs: Not on file  Physical Activity: Not on file  Stress: Not on file  Social Connections: Not on file     Family History: The patient's ***family history includes Hyperlipidemia in an other family member; Stroke in an other family member.  ROS:   Please see the history of present illness.    *** All other systems reviewed and are negative.  EKGs/Labs/Other Studies Reviewed:    The following  studies were reviewed today: ***  EKG:  EKG is *** ordered today.  The ekg ordered today demonstrates ***  Recent Labs: No results found for requested labs within last 365 days.  Recent Lipid Panel No results found for: "CHOL", "TRIG", "HDL", "CHOLHDL", "VLDL", "LDLCALC", "LDLDIRECT"   Risk Assessment/Calculations:   {Does this patient have ATRIAL FIBRILLATION?[phone]}  No BP recorded.  {Refresh Note OR Click here to enter BP  :1}***         Physical Exam:    VS:  There were no vitals taken for this visit.    Wt Readings from Last 3 Encounters:  02/18/16 (!) 324 lb (147 kg)     GEN: *** Well nourished, well developed in no acute distress HEENT: Normal NECK: No JVD; No carotid bruits LYMPHATICS: No lymphadenopathy CARDIAC: ***RRR, no murmurs, rubs, gallops RESPIRATORY:  Clear to auscultation without rales, wheezing or rhonchi  ABDOMEN: Soft, non-tender, non-distended MUSCULOSKELETAL:  No edema; No deformity  SKIN: Warm and dry NEUROLOGIC:  Alert and oriented x 3 PSYCHIATRIC:  Normal affect   ASSESSMENT:    No diagnosis found. PLAN:    In order of problems listed above:  ***      {Are you ordering a CV Procedure (e.g. stress test, cath, DCCV, TEE, etc)?   Press  F2        [id number]    Medication Adjustments/Labs and Tests Ordered: Current medicines are reviewed at length with the patient today.  Concerns regarding medicines are outlined above.  No orders of the defined types were placed in this encounter.  No orders of the defined types were placed in this encounter.   There are no Patient Instructions on file for this visit.   Signed, Freada Bergeron, MD  05/23/2022 2:23 PM    [place]

## 2022-05-24 ENCOUNTER — Ambulatory Visit: Payer: 59 | Admitting: Cardiology

## 2022-05-26 ENCOUNTER — Ambulatory Visit: Payer: 59 | Attending: Cardiology | Admitting: Cardiology

## 2022-05-26 ENCOUNTER — Encounter: Payer: Self-pay | Admitting: Cardiology

## 2022-05-26 VITALS — BP 128/84 | HR 70 | Ht 66.5 in | Wt 276.0 lb

## 2022-05-26 DIAGNOSIS — E782 Mixed hyperlipidemia: Secondary | ICD-10-CM

## 2022-05-26 DIAGNOSIS — I2510 Atherosclerotic heart disease of native coronary artery without angina pectoris: Secondary | ICD-10-CM | POA: Diagnosis not present

## 2022-05-26 DIAGNOSIS — Z79899 Other long term (current) drug therapy: Secondary | ICD-10-CM | POA: Diagnosis not present

## 2022-05-26 DIAGNOSIS — R011 Cardiac murmur, unspecified: Secondary | ICD-10-CM

## 2022-05-26 DIAGNOSIS — Z8679 Personal history of other diseases of the circulatory system: Secondary | ICD-10-CM

## 2022-05-26 NOTE — Progress Notes (Signed)
Cardiology Office Note:    Date:  05/26/2022   ID:  Katrina Garner, DOB Oct 18, 1960, MRN [id number]  PCP:  Shirlean Mylar, MD   [facility] Providers Cardiologist:  None   Referring MD: Shon Hale, *    History of Present Illness:    Katrina Garner is a 61 y.o. female with a hx of HTN, HLD, coronary artery Ca, and DMII who was referred by Dr. Chanetta Marshall for further evaluation of coronary artery calcification.  Patient seen by Dr. Chanetta Marshall on 04/25/22. Note reviewed. She had coronary Ca score which was elevated at 348 (3v Ca) which is the 95% for age, gender, race matched controls. She is now referred to Cardiology for further evaluation.  Today, she reports that she is not having any cardiac symptoms but she is anxious after getting the results of her coronary Ca score.  States she has a strong family history of CAD. Specifically, her mother, aunt and brother have all had CABG procedures. Her brother also has had several stents, the first in his 87s, but he does have the best lifestyle. Her uncle passed from a heart attack at 77 years of age.   She overall feels well from a CV standpoint. She denies any chest pain, shortness of breath, or peripheral edema. No lightheadedness, headaches, syncope, orthopnea, or PND. She does an hour of weights and treadmill work with a trainer a few times a week. She is also trying to walk more. She feels well with activity.   Past Medical History:  Diagnosis Date   DM (diabetes mellitus) (HCC)    Hyperlipidemia    Hypertension     History reviewed. No pertinent surgical history.  Current Medications: Current Meds  Medication Sig   atorvastatin (LIPITOR) 80 MG tablet Take 80 mg by mouth daily.   fenofibrate 160 MG tablet Take 160 mg by mouth daily.   hydrochlorothiazide (HYDRODIURIL) 25 MG tablet Take 25 mg by mouth daily.   metFORMIN (GLUCOPHAGE) 500 MG tablet Take 500 mg by mouth 2 (two) times daily with a meal.   Semaglutide  (OZEMPIC, 1 MG/DOSE, [place]) Inject 1 mg as directed once a week.     Allergies:   Patient has no allergy information on record.   Social History   Socioeconomic History   Marital status: Widowed    Spouse name: Not on file   Number of children: Not on file   Years of education: Not on file   Highest education level: Not on file  Occupational History   Not on file  Tobacco Use   Smoking status: Former    Types: Cigarettes   Smokeless tobacco: Not on file  Substance and Sexual Activity   Alcohol use: Yes   Drug use: Not on file   Sexual activity: Not on file  Other Topics Concern   Not on file  Social History Narrative   Not on file   Social Determinants of Health   Financial Resource Strain: Not on file  Food Insecurity: Not on file  Transportation Needs: Not on file  Physical Activity: Not on file  Stress: Not on file  Social Connections: Not on file     Family History: The patient's family history includes Hyperlipidemia in an other family member; Stroke in an other family member.  ROS:   Review of Systems  Constitutional:  Negative for chills and fever.  HENT:  Negative for hearing loss and tinnitus.   Eyes:  Negative for blurred vision  and double vision.  Respiratory:  Negative for cough and hemoptysis.   Cardiovascular:  Positive for palpitations. Negative for chest pain, orthopnea, claudication, leg swelling and PND.  Skin:  Negative for rash.     EKGs/Labs/Other Studies Reviewed:    The following studies were reviewed today: Ca Score 04/2022: FINDINGS: Non-cardiac: No significant non cardiac findings on limited lung and soft tissue windows. See separate report from [facility].   Ascending Aorta: Normal diameter 3.5 cm   Pericardium: Normal   Coronary arteries: 3 vessel calcium noted   LM 0   LAD 181   LCX 89.2   RCA 77.9   Total 348   IMPRESSION: Coronary calcium score of 348. This was 95 th percentile for age and sex matched  control.  EKG:  EKG is personally reviewed.  05/26/2022: Normal sinus rhythm. HR 70 bpm.   Recent Labs: No results found for requested labs within last 365 days.  Recent Lipid Panel No results found for: "CHOL", "TRIG", "HDL", "CHOLHDL", "VLDL", "LDLCALC", "LDLDIRECT"   Risk Assessment/Calculations:                Physical Exam:    VS:  BP 128/84   Pulse 70   Ht 5' 6.5" (1.689 m)   Wt 276 lb (125.2 kg)   SpO2 93%   BMI 43.88 kg/m     Wt Readings from Last 3 Encounters:  05/26/22 276 lb (125.2 kg)  02/18/16 (!) 324 lb (147 kg)     GEN:  Well nourished, well developed in no acute distress HEENT: Normal NECK: No JVD; No carotid bruits LYMPHATICS: No lymphadenopathy CARDIAC: RRR,  2/6 systolic murmur, no rubs, gallops. RESPIRATORY:  Clear to auscultation without rales, wheezing or rhonchi  ABDOMEN: Soft, non-tender, non-distended MUSCULOSKELETAL:  No edema; No deformity  SKIN: Warm and dry NEUROLOGIC:  Alert and oriented x 3 PSYCHIATRIC:  Normal affect   ASSESSMENT:    1. Coronary artery disease involving native coronary artery of native heart without angina pectoris   2. Murmur   3. Mixed hyperlipidemia   4. Medication management   5. History of CAD (coronary artery disease)    PLAN:    In order of problems listed above:  #Coronary Artery Disease with Elevated Ca score: Ca score 348 (95%) with 3 vessel disease. Currently, asymptomatic and active without anginal symptoms.  -Continue lipitor 80mg  daily -Continue fenofibrate 160mg  daily -Check NMR, Lp(a), apolipoprotein B -Lifestyle modifications as below  #Mixed HLD: LDL 56, TG 321, HDL 31. States TG were better controlled and wants to repeat before adding vascepa.  -Continue lipitor 80mg  daily -Continue fenofibrate 160mg  daily -Check NMR, Lp(a), apolipoprotein B -If TG>150, will add vascepa  #DMII: -On ozempic; managed by PCP  #Systolic Murmur: -Check TTE  #Morbid Obesity: BMI 43. Working on  diet and exercise. -On ozempic -Continue lifestyle modifications as below  Exercise recommendations: Goal of exercising for at least 30 minutes a day, at least 5 times per week.  Please exercise to a moderate exertion.  This means that while exercising it is difficult to speak in full sentences, however you are not so short of breath that you feel you must stop, and not so comfortable that you can carry on a full conversation.  Exertion level should be approximately a 5/10, if 10 is the most exertion you can perform.  Diet recommendations: Recommend a heart healthy diet such as the Mediterranean diet.  This diet consists of plant based foods, healthy fats, lean  meats, olive oil.  It suggests limiting the intake of simple carbohydrates such as white breads, pastries, and pastas.  It also limits the amount of red meat, wine, and dairy products such as cheese that one should consume on a daily basis.           Follow Up: 6 months Medication Adjustments/Labs and Tests Ordered: Current medicines are reviewed at length with the patient today.  Concerns regarding medicines are outlined above.  Orders Placed This Encounter  Procedures   NMR, lipoprofile   Lipoprotein A (LPA)   Apolipoprotein B   ECHOCARDIOGRAM COMPLETE   No orders of the defined types were placed in this encounter.   Patient Instructions  Medication Instructions:   Your physician recommends that you continue on your current medications as directed. Please refer to the Current Medication list given to you today.  *If you need a refill on your cardiac medications before your next appointment, please call your pharmacy*   Lab Work:  SOMETIME SOON IN THE OFFICE--NMR LIPOPROFILE, LIPOPROTEIN A, APOLIPOPROTEIN B--PLEASE COME FASTING TO THIS LAB APPOINTMENT  If you have labs (blood work) drawn today and your tests are completely normal, you will receive your results only by: MyChart Message (if you have MyChart) OR A paper  copy in the mail If you have any lab test that is abnormal or we need to change your treatment, we will call you to review the results.   Testing/Procedures:  Your physician has requested that you have an echocardiogram. Echocardiography is a painless test that uses sound waves to create images of your heart. It provides your doctor with information about the size and shape of your heart and how well your heart's chambers and valves are working. This procedure takes approximately one hour. There are no restrictions for this procedure. Please do NOT wear cologne, perfume, aftershave, or lotions (deodorant is allowed). Please arrive 15 minutes prior to your appointment time.    Follow-Up: At [facility], you and your health needs are our priority.  As part of our continuing mission to provide you with exceptional heart care, we have created designated Provider Care Teams.  These Care Teams include your primary Cardiologist (physician) and Advanced Practice Providers (APPs -  Physician Assistants and Nurse Practitioners) who all work together to provide you with the care you need, when you need it.  We recommend signing up for the patient portal called "MyChart".  Sign up information is provided on this After Visit Summary.  MyChart is used to connect with patients for Virtual Visits (Telemedicine).  Patients are able to view lab/test results, encounter notes, upcoming appointments, etc.  Non-urgent messages can be sent to your provider as well.   To learn more about what you can do with MyChart, go to [email or web address].    Your next appointment:   6 month(s)  The format for your next appointment:   In Person  Provider:   DR. Shari Prows  Important Information About Sugar          Jodelle Gross Ford,acting as a scribe for Meriam Sprague, MD.,have documented all relevant documentation on the behalf of Meriam Sprague, MD,as directed by  Meriam Sprague, MD while  in the presence of Meriam Sprague, MD.   I, Meriam Sprague, MD, have reviewed all documentation for this visit. The documentation on 05/26/22 for the exam, diagnosis, procedures, and orders are all accurate and complete.   Signed, Meriam Sprague, MD  05/26/2022 12:45  PM    [place]

## 2022-05-26 NOTE — Patient Instructions (Signed)
Medication Instructions:   Your physician recommends that you continue on your current medications as directed. Please refer to the Current Medication list given to you today.  *If you need a refill on your cardiac medications before your next appointment, please call your pharmacy*   Lab Work:  SOMETIME SOON IN THE OFFICE--NMR LIPOPROFILE, LIPOPROTEIN A, APOLIPOPROTEIN B--PLEASE COME FASTING TO THIS LAB APPOINTMENT  If you have labs (blood work) drawn today and your tests are completely normal, you will receive your results only by: MyChart Message (if you have MyChart) OR A paper copy in the mail If you have any lab test that is abnormal or we need to change your treatment, we will call you to review the results.   Testing/Procedures:  Your physician has requested that you have an echocardiogram. Echocardiography is a painless test that uses sound waves to create images of your heart. It provides your doctor with information about the size and shape of your heart and how well your heart's chambers and valves are working. This procedure takes approximately one hour. There are no restrictions for this procedure. Please do NOT wear cologne, perfume, aftershave, or lotions (deodorant is allowed). Please arrive 15 minutes prior to your appointment time.    Follow-Up: At [facility], you and your health needs are our priority.  As part of our continuing mission to provide you with exceptional heart care, we have created designated Provider Care Teams.  These Care Teams include your primary Cardiologist (physician) and Advanced Practice Providers (APPs -  Physician Assistants and Nurse Practitioners) who all work together to provide you with the care you need, when you need it.  We recommend signing up for the patient portal called "MyChart".  Sign up information is provided on this After Visit Summary.  MyChart is used to connect with patients for Virtual Visits (Telemedicine).   Patients are able to view lab/test results, encounter notes, upcoming appointments, etc.  Non-urgent messages can be sent to your provider as well.   To learn more about what you can do with MyChart, go to [email or web address].    Your next appointment:   6 month(s)  The format for your next appointment:   In Person  Provider:   DR. Shari Prows  Important Information About Sugar

## 2022-05-30 NOTE — Addendum Note (Signed)
Addended by: Gweneth Fredlund R on: 05/30/2022 10:55 AM   Modules accepted: Orders  

## 2022-06-15 ENCOUNTER — Ambulatory Visit: Payer: 59 | Attending: Cardiology

## 2022-06-15 ENCOUNTER — Ambulatory Visit (HOSPITAL_COMMUNITY): Payer: 59 | Attending: Cardiovascular Disease

## 2022-06-15 DIAGNOSIS — Z79899 Other long term (current) drug therapy: Secondary | ICD-10-CM

## 2022-06-15 DIAGNOSIS — R011 Cardiac murmur, unspecified: Secondary | ICD-10-CM | POA: Diagnosis present

## 2022-06-15 DIAGNOSIS — E782 Mixed hyperlipidemia: Secondary | ICD-10-CM

## 2022-06-15 DIAGNOSIS — Z8679 Personal history of other diseases of the circulatory system: Secondary | ICD-10-CM

## 2022-06-15 LAB — ECHOCARDIOGRAM COMPLETE
Area-P 1/2: 2.8 cm2
S' Lateral: 3.1 cm

## 2022-06-16 LAB — NMR, LIPOPROFILE
Cholesterol, Total: 129 mg/dL (ref 100–199)
HDL Particle Number: 31.9 umol/L (ref >=30.5)
HDL-C: 34 mg/dL — ABNORMAL LOW (ref >39)
LDL Particle Number: 979 nmol/L (ref <1000)
LDL Size: 20.3 nm — ABNORMAL LOW (ref >20.5)
LDL-C (NIH Calc): 68 mg/dL (ref 0–99)
LP-IR Score: 79 — ABNORMAL HIGH (ref <=45)
Small LDL Particle Number: 520 nmol/L (ref <=527)
Triglycerides: 153 mg/dL — ABNORMAL HIGH (ref 0–149)

## 2022-06-16 LAB — APOLIPOPROTEIN B: Apolipoprotein B: 74 mg/dL (ref <90)

## 2022-06-16 LAB — LIPOPROTEIN A (LPA): Lipoprotein (a): 36.2 nmol/L (ref <75.0)

## 2022-12-01 NOTE — Progress Notes (Signed)
Cardiology Office Note:    Date:  12/02/2022   ID:  Katrina Garner, DOB February 25, 1961, MRN [id number]  PCP:  Shirlean Mylar, MD   [facility] Providers Cardiologist:  None   Referring MD: Shirlean Mylar, MD    History of Present Illness:    Katrina Garner is a 62 y.o. female with a hx of HTN, HLD, coronary artery Ca, and DMII who presents to clinic for follow-up.  Patient seen by Dr. Chanetta Marshall on 04/25/22. She had coronary Ca score which was elevated at 348 (3v Ca) which is the 95% for age, gender, race matched controls. She was then referred to Cardiology for further evaluation.  Initially seen 05/2022 where she was doing well from a CV standpoint. Had strong family history of CAD Specifically, her mother, aunt and brother have all had CABG procedures. Her brother also has had several stents, the first in his 65s. Her uncle passed from a heart attack at 54 years of age. We continued medical therapy at that time.  TTE for murmur 06/2022 with LVEF 60-65%, aortic sclerosis, no other valve disease.  Today, the patient overall feels well. She has began to increase her exercise and is walking at least a mile 3x/week and works with a trainor 1x/week. She feels much better after starting exercise and has lost 14lbs since her prior visit. No chest pain, SOB, LE edema, orthopnea or PND. Blood pressure well controlled. Had one episode of heart racing but it resolved within a couple of minutes and has not recurred.     Past Medical History:  Diagnosis Date   DM (diabetes mellitus) (HCC)    Hyperlipidemia    Hypertension     No past surgical history on file.  Current Medications: Current Meds  Medication Sig   atorvastatin (LIPITOR) 80 MG tablet Take 80 mg by mouth daily.   fenofibrate 160 MG tablet Take 160 mg by mouth daily.   hydrochlorothiazide (HYDRODIURIL) 25 MG tablet Take 25 mg by mouth daily.   metFORMIN (GLUCOPHAGE) 500 MG tablet Take 500 mg by mouth daily.   Semaglutide  (OZEMPIC, 1 MG/DOSE, [place]) Inject 1 mg as directed once a week.     Allergies:   Patient has no allergy information on record.   Social History   Socioeconomic History   Marital status: Widowed    Spouse name: Not on file   Number of children: Not on file   Years of education: Not on file   Highest education level: Not on file  Occupational History   Not on file  Tobacco Use   Smoking status: Former    Types: Cigarettes   Smokeless tobacco: Former  Substance and Sexual Activity   Alcohol use: Yes   Drug use: Not on file   Sexual activity: Not Currently  Other Topics Concern   Not on file  Social History Narrative   Not on file   Social Determinants of Health   Financial Resource Strain: Not on file  Food Insecurity: Not on file  Transportation Needs: Not on file  Physical Activity: Not on file  Stress: Not on file  Social Connections: Not on file     Family History: The patient's family history includes Hyperlipidemia in an other family member; Stroke in an other family member.  ROS:   As per HPI   EKGs/Labs/Other Studies Reviewed:    The following studies were reviewed today: Ca Score 04/2022: FINDINGS: Non-cardiac: No significant non cardiac findings on limited lung  and soft tissue windows. See separate report from [facility].   Ascending Aorta: Normal diameter 3.5 cm   Pericardium: Normal   Coronary arteries: 3 vessel calcium noted   LM 0   LAD 181   LCX 89.2   RCA 77.9   Total 348   IMPRESSION: Coronary calcium score of 348. This was 95 th percentile for age and sex matched control.  EKG:  No new tracing  Recent Labs: No results found for requested labs within last 365 days.  Recent Lipid Panel No results found for: "CHOL", "TRIG", "HDL", "CHOLHDL", "VLDL", "LDLCALC", "LDLDIRECT"   Risk Assessment/Calculations:                Physical Exam:    VS:  BP 126/72   Pulse 66   Ht 5\' 7"  (1.702 m)   Wt 262 lb (118.8 kg)    SpO2 96%   BMI 41.04 kg/m     Wt Readings from Last 3 Encounters:  12/02/22 262 lb (118.8 kg)  05/26/22 276 lb (125.2 kg)  02/18/16 (!) 324 lb (147 kg)     GEN:  Well nourished, well developed in no acute distress HEENT: Normal NECK: No JVD; No carotid bruits CARDIAC: RRR, 2/6 systolic murmur RESPIRATORY:  CTAB, no wheezes ABDOMEN: Soft, non-tender, non-distended MUSCULOSKELETAL:  No edema; No deformity  SKIN: Warm and dry NEUROLOGIC:  Alert and oriented x 3 PSYCHIATRIC:  Normal affect   ASSESSMENT:    1. Coronary artery disease involving native coronary artery of native heart without angina pectoris   2. Mixed hyperlipidemia   3. Morbid obesity (HCC)   4. Type 2 diabetes mellitus without complication, without long-term current use of insulin (HCC)    PLAN:    In order of problems listed above:  #Coronary Artery Disease with Elevated Ca score: Ca score 348 (95%) with 3 vessel disease. Currently, asymptomatic and active without anginal symptoms.  -Continue lipitor 80mg  daily -Continue fenofibrate 160mg  daily -Lifestyle modifications as below  #Mixed HLD: LDL68, HDL 40, TG 153. -Continue lipitor 80mg  daily -Continue fenofibrate 160mg  daily  #DMII: -On ozempic; managed by PCP  #Morbid Obesity: BMI 41. Working on diet and exercise. -On ozempic -Continue lifestyle modifications as below  Exercise recommendations: Goal of exercising for at least 30 minutes a day, at least 5 times per week.  Please exercise to a moderate exertion.  This means that while exercising it is difficult to speak in full sentences, however you are not so short of breath that you feel you must stop, and not so comfortable that you can carry on a full conversation.  Exertion level should be approximately a 5/10, if 10 is the most exertion you can perform.  Diet recommendations: Recommend a heart healthy diet such as the Mediterranean diet.  This diet consists of plant based foods, healthy  fats, lean meats, olive oil.  It suggests limiting the intake of simple carbohydrates such as white breads, pastries, and pastas.  It also limits the amount of red meat, wine, and dairy products such as cheese that one should consume on a daily basis.       Medication Adjustments/Labs and Tests Ordered: Current medicines are reviewed at length with the patient today.  Concerns regarding medicines are outlined above.  No orders of the defined types were placed in this encounter.  No orders of the defined types were placed in this encounter.   There are no Patient Instructions on file for this visit.  Signed, Meriam Sprague, MD  12/02/2022 11:23 AM    [facility]

## 2022-12-02 ENCOUNTER — Encounter: Payer: Self-pay | Admitting: Cardiology

## 2022-12-02 ENCOUNTER — Ambulatory Visit: Payer: 59 | Attending: Cardiology | Admitting: Cardiology

## 2022-12-02 VITALS — BP 126/72 | HR 66 | Ht 67.0 in | Wt 262.0 lb

## 2022-12-02 DIAGNOSIS — I2510 Atherosclerotic heart disease of native coronary artery without angina pectoris: Secondary | ICD-10-CM | POA: Diagnosis not present

## 2022-12-02 DIAGNOSIS — E119 Type 2 diabetes mellitus without complications: Secondary | ICD-10-CM | POA: Diagnosis not present

## 2022-12-02 DIAGNOSIS — E782 Mixed hyperlipidemia: Secondary | ICD-10-CM | POA: Diagnosis not present

## 2022-12-02 DIAGNOSIS — Z7984 Long term (current) use of oral hypoglycemic drugs: Secondary | ICD-10-CM

## 2022-12-02 NOTE — Patient Instructions (Signed)
Medication Instructions:  Your physician recommends that you continue on your current medications as directed. Please refer to the Current Medication list given to you today.  *If you need a refill on your cardiac medications before your next appointment, please call your pharmacy*  Follow-Up: At [facility], you and your health needs are our priority.  As part of our continuing mission to provide you with exceptional heart care, we have created designated Provider Care Teams.  These Care Teams include your primary Cardiologist (physician) and Advanced Practice Providers (APPs -  Physician Assistants and Nurse Practitioners) who all work together to provide you with the care you need, when you need it.  We recommend signing up for the patient portal called "MyChart".  Sign up information is provided on this After Visit Summary.  MyChart is used to connect with patients for Virtual Visits (Telemedicine).  Patients are able to view lab/test results, encounter notes, upcoming appointments, etc.  Non-urgent messages can be sent to your provider as well.   To learn more about what you can do with MyChart, go to [email or web address].    Your next appointment:   1 year(s)  Provider:   Bridgette Christopher, MD    

## 2023-03-07 ENCOUNTER — Other Ambulatory Visit (HOSPITAL_BASED_OUTPATIENT_CLINIC_OR_DEPARTMENT_OTHER): Payer: Self-pay | Admitting: Family Medicine

## 2023-03-07 DIAGNOSIS — R319 Hematuria, unspecified: Secondary | ICD-10-CM

## 2023-03-08 ENCOUNTER — Encounter (HOSPITAL_COMMUNITY): Payer: Self-pay

## 2023-03-08 ENCOUNTER — Ambulatory Visit (HOSPITAL_COMMUNITY): Payer: 59
